# Patient Record
Sex: Male | Born: 1975 | Race: Asian | Hispanic: No | Marital: Married | Smoking: Never smoker
Health system: Southern US, Community
[De-identification: ages and names within clinical notes are randomized; demographics above are authoritative.]

## PROBLEM LIST (undated history)

## (undated) DIAGNOSIS — I10 Essential (primary) hypertension: Secondary | ICD-10-CM

## (undated) DIAGNOSIS — F1090 Alcohol use, unspecified, uncomplicated: Secondary | ICD-10-CM

## (undated) DIAGNOSIS — R519 Headache, unspecified: Secondary | ICD-10-CM

## (undated) DIAGNOSIS — R000 Tachycardia, unspecified: Secondary | ICD-10-CM

## (undated) DIAGNOSIS — E782 Mixed hyperlipidemia: Secondary | ICD-10-CM

## (undated) HISTORY — DX: Mixed hyperlipidemia: E78.2

## (undated) HISTORY — DX: Essential (primary) hypertension: I10

## (undated) HISTORY — DX: Alcohol use, unspecified, uncomplicated: F10.90

## (undated) HISTORY — DX: Tachycardia, unspecified: R00.0

## (undated) HISTORY — DX: Headache, unspecified: R51.9

---

## 2015-04-14 ENCOUNTER — Emergency Department (HOSPITAL_COMMUNITY)
Admission: EM | Admit: 2015-04-14 | Discharge: 2015-04-14 | Disposition: A | Discharge status: Home / Self Care | Payer: 59 | Attending: Emergency Medicine | Admitting: Emergency Medicine

## 2015-04-14 ENCOUNTER — Encounter (HOSPITAL_COMMUNITY): Payer: Self-pay | Admitting: Emergency Medicine

## 2015-04-14 DIAGNOSIS — M5440 Lumbago with sciatica, unspecified side: Secondary | ICD-10-CM | POA: Insufficient documentation

## 2015-04-14 DIAGNOSIS — M545 Low back pain: Secondary | ICD-10-CM | POA: Diagnosis present

## 2015-04-14 DIAGNOSIS — M5441 Lumbago with sciatica, right side: Secondary | ICD-10-CM

## 2015-04-14 DIAGNOSIS — M5442 Lumbago with sciatica, left side: Secondary | ICD-10-CM

## 2015-04-14 MED ORDER — IBUPROFEN 800 MG PO TABS: 800.0000 mg | ORAL_TABLET | Freq: Three times a day (TID) | ORAL | Status: AC

## 2015-04-14 MED ORDER — HYDROCODONE-ACETAMINOPHEN 5-325 MG PO TABS
1.0000 | ORAL_TABLET | Freq: Four times a day (QID) | ORAL | Status: AC | PRN
Start: 2015-04-14 — End: ?

## 2015-04-14 NOTE — ED Provider Notes (Signed)
CSN: [id number]     Arrival date & time 04/14/15  1715 History  This chart was scribed for non-physician practitioner, Roxy Horsemanobert [facility] Teegarden, PA-C working with Azalia BilisKevin Campos, MD by Doreatha MartinEva Mathews, ED scribe. This patient was seen in room WTR6/WTR6 and the patient's care was started at 5:32 PM    Chief Complaint  Patient presents with  . Back Pain   The history is provided by the patient. No language interpreter was used.    HPI Comments: Victor Sweeney is a 39 y.o. male who presents to the Emergency Department with a chief complaint of intermittent, moderate, pinching back pain onset 5 years ago and worsened this morning at 0800. Pt reports that he bent down this morning and his pain began after that. Pt notes that pain can radiate down his legs when he walks. He states that pain is relieved with sitting and is worsened by standing erect, movement and walking. Pt states that at work, he lifts 20 lbs or less. He denies heavy lifting, falls, or prior injury. He also denies dysuria, numbness, and incontinence x2.   History reviewed. No pertinent past medical history. History reviewed. No pertinent past surgical history. No family history on file. History  Substance Use Topics  . Smoking status: Never Smoker   . Smokeless tobacco: Not on file  . Alcohol Use: No    Review of Systems  Constitutional: Negative for fever and chills.  Gastrointestinal:       No bowel incontinence  Genitourinary: Negative for dysuria.       Negative for bowel and bladder incontinence.   Musculoskeletal: Positive for myalgias, back pain and arthralgias.  Neurological: Positive for weakness. Negative for numbness.       No saddle anesthesia   Allergies  Review of patient's allergies indicates no known allergies.  Home Medications   Prior to Admission medications   Not on File   BP 117/90 mmHg  Pulse 95  Temp(Src) 98.6 F (37 C) (Oral)  Resp 17  Ht 5' 6.5" (1.689 m)  Wt 150 lb (68.04 kg)  BMI 23.85 kg/m2  SpO2  100% Physical Exam  Constitutional: He is oriented to person, place, and time. He appears well-developed and well-nourished. No distress.  HENT:  Head: Normocephalic and atraumatic.  Eyes: Conjunctivae and EOM are normal. Pupils are equal, round, and reactive to light. Right eye exhibits no discharge. Left eye exhibits no discharge. No scleral icterus.  Neck: Normal range of motion. Neck supple. No tracheal deviation present.  Cardiovascular: Normal rate, regular rhythm and normal heart sounds.  Exam reveals no gallop and no friction rub.   No murmur heard. Pulmonary/Chest: Effort normal and breath sounds normal. No respiratory distress. He has no wheezes.  Abdominal: Soft. He exhibits no distension. There is no tenderness.  Musculoskeletal: Normal range of motion.  Lumbar paraspinal muscles tender to palpation, no bony tenderness, step-offs, or gross abnormality or deformity of spine, patient is able to ambulate, moves all extremities  Bilateral great toe extension intact Bilateral plantar/dorsiflexion intact  Neurological: He is alert and oriented to person, place, and time. He has normal reflexes.  Sensation and strength intact bilaterally Symmetrical reflexes  Skin: Skin is warm and dry. He is not diaphoretic.  Psychiatric: He has a normal mood and affect. His behavior is normal. Judgment and thought content normal.  Nursing note and vitals reviewed.  ED Course  Procedures (including critical care time) DIAGNOSTIC STUDIES: Oxygen Saturation is 100% on RA, normal by my interpretation.  COORDINATION OF CARE: 5:38 PM Discussed treatment plan with pt at bedside and pt agreed to plan.   Labs Review Labs Reviewed - No data to display  Imaging Review No results found.   EKG Interpretation None      MDM   Final diagnoses:  Bilateral low back pain with sciatica, sciatica laterality unspecified    Patient with back pain.  No neurological deficits and normal neuro exam.   Patient is ambulatory.  No loss of bowel or bladder control.  Doubt cauda equina.  Denies fever,  doubt epidural abscess or other lesion. Recommend back exercises, stretching, RICE, and will treat with a short course of norco.  Encouraged the patient that there could be a need for additional workup and/or imaging such as MRI, if the symptoms do not resolve. Patient advised that if the back pain does not resolve, or radiates, this could progress to more serious conditions and is encouraged to follow-up with PCP or orthopedics within 2 weeks.    I personally performed the services described in this documentation, which was scribed in my presence. The recorded information has been reviewed and is accurate.    Roxy Horseman, PA-C 04/14/15 1812  Azalia Bilis, MD 04/15/15 [phone]

## 2015-04-14 NOTE — ED Notes (Signed)
Pt states that his lower back started hurting around 8am this morning while working.  Pt states that he works at [employer]. Pt denies lifting anything heavy, maybe about 5lbs.

## 2015-04-14 NOTE — Discharge Instructions (Signed)
Back Pain, Adult Low back pain is very common. About 1 in 5 people have back pain.The cause of low back pain is rarely dangerous. The pain often gets better over time.About half of people with a sudden onset of back pain feel better in just 2 weeks. About 8 in 10 people feel better by 6 weeks.  CAUSES Some common causes of back pain include:  Strain of the muscles or ligaments supporting the spine.  Wear and tear (degeneration) of the spinal discs.  Arthritis.  Direct injury to the back. DIAGNOSIS Most of the time, the direct cause of low back pain is not known.However, back pain can be treated effectively even when the exact cause of the pain is unknown.Answering your caregiver's questions about your overall health and symptoms is one of the most accurate ways to make sure the cause of your pain is not dangerous. If your caregiver needs more information, he or she may order lab work or imaging tests (X-rays or MRIs).However, even if imaging tests show changes in your back, this usually does not require surgery. HOME CARE INSTRUCTIONS For many people, back pain returns.Since low back pain is rarely dangerous, it is often a condition that people can learn to manageon their own.   Remain active. It is stressful on the back to sit or stand in one place. Do not sit, drive, or stand in one place for more than 30 minutes at a time. Take short walks on level surfaces as soon as pain allows.Try to increase the length of time you walk each day.  Do not stay in bed.Resting more than 1 or 2 days can delay your recovery.  Do not avoid exercise or work.Your body is made to move.It is not dangerous to be active, even though your back may hurt.Your back will likely heal faster if you return to being active before your pain is gone.  Pay attention to your body when you bend and lift. Many people have less discomfortwhen lifting if they bend their knees, keep the load close to their bodies,and  avoid twisting. Often, the most comfortable positions are those that put less stress on your recovering back.  Find a comfortable position to sleep. Use a firm mattress and lie on your side with your knees slightly bent. If you lie on your back, put a pillow under your knees.  Only take over-the-counter or prescription medicines as directed by your caregiver. Over-the-counter medicines to reduce pain and inflammation are often the most helpful.Your caregiver may prescribe muscle relaxant drugs.These medicines help dull your pain so you can more quickly return to your normal activities and healthy exercise.  Put ice on the injured area.  Put ice in a plastic bag.  Place a towel between your skin and the bag.  Leave the ice on for 15-20 minutes, 03-04 times a day for the first 2 to 3 days. After that, ice and heat may be alternated to reduce pain and spasms.  Ask your caregiver about trying back exercises and gentle massage. This may be of some benefit.  Avoid feeling anxious or stressed.Stress increases muscle tension and can worsen back pain.It is important to recognize when you are anxious or stressed and learn ways to manage it.Exercise is a great option. SEEK MEDICAL CARE IF:  You have pain that is not relieved with rest or medicine.  You have pain that does not improve in 1 week.  You have new symptoms.  You are generally not feeling well. SEEK   IMMEDIATE MEDICAL CARE IF:   You have pain that radiates from your back into your legs.  You develop new bowel or bladder control problems.  You have unusual weakness or numbness in your arms or legs.  You develop nausea or vomiting.  You develop abdominal pain.  You feel faint. Document Released: 10/23/2005 Document Revised: 04/23/2012 Document Reviewed: 02/24/2014 Exit[employer] Patient Information 2015 [employer]. This information is not intended to replace advice given to you by your health care provider. Make sure you  discuss any questions you have with your health care provider.  

## 2016-04-13 DIAGNOSIS — Z0000 Encounter for general adult medical examination without abnormal findings: Secondary | ICD-10-CM | POA: Diagnosis not present

## 2016-04-13 DIAGNOSIS — R0683 Snoring: Secondary | ICD-10-CM | POA: Diagnosis not present

## 2016-04-13 DIAGNOSIS — E785 Hyperlipidemia, unspecified: Secondary | ICD-10-CM | POA: Diagnosis not present

## 2016-04-13 DIAGNOSIS — Z6824 Body mass index (BMI) 24.0-24.9, adult: Secondary | ICD-10-CM | POA: Diagnosis not present

## 2016-04-13 DIAGNOSIS — M545 Low back pain: Secondary | ICD-10-CM | POA: Diagnosis not present

## 2016-05-17 DIAGNOSIS — R0682 Tachypnea, not elsewhere classified: Secondary | ICD-10-CM | POA: Diagnosis not present

## 2016-05-29 DIAGNOSIS — G4730 Sleep apnea, unspecified: Secondary | ICD-10-CM | POA: Diagnosis not present

## 2017-07-04 DIAGNOSIS — H40023 Open angle with borderline findings, high risk, bilateral: Secondary | ICD-10-CM | POA: Diagnosis not present

## 2017-07-16 DIAGNOSIS — R51 Headache: Secondary | ICD-10-CM | POA: Diagnosis not present

## 2017-07-16 DIAGNOSIS — I10 Essential (primary) hypertension: Secondary | ICD-10-CM | POA: Diagnosis not present

## 2017-08-16 DIAGNOSIS — Z23 Encounter for immunization: Secondary | ICD-10-CM | POA: Diagnosis not present

## 2017-09-07 DIAGNOSIS — R51 Headache: Secondary | ICD-10-CM | POA: Diagnosis not present

## 2017-09-14 DIAGNOSIS — E782 Mixed hyperlipidemia: Secondary | ICD-10-CM | POA: Diagnosis not present

## 2017-09-14 DIAGNOSIS — R945 Abnormal results of liver function studies: Secondary | ICD-10-CM | POA: Diagnosis not present

## 2017-12-19 DIAGNOSIS — Z13220 Encounter for screening for lipoid disorders: Secondary | ICD-10-CM | POA: Diagnosis not present

## 2017-12-19 DIAGNOSIS — R030 Elevated blood-pressure reading, without diagnosis of hypertension: Secondary | ICD-10-CM | POA: Diagnosis not present

## 2017-12-19 DIAGNOSIS — Z0000 Encounter for general adult medical examination without abnormal findings: Secondary | ICD-10-CM | POA: Diagnosis not present

## 2018-05-24 DIAGNOSIS — R030 Elevated blood-pressure reading, without diagnosis of hypertension: Secondary | ICD-10-CM | POA: Diagnosis not present

## 2018-05-24 DIAGNOSIS — G43009 Migraine without aura, not intractable, without status migrainosus: Secondary | ICD-10-CM | POA: Diagnosis not present

## 2018-06-02 DIAGNOSIS — T63441A Toxic effect of venom of bees, accidental (unintentional), initial encounter: Secondary | ICD-10-CM | POA: Diagnosis not present

## 2018-06-02 DIAGNOSIS — S80261A Insect bite (nonvenomous), right knee, initial encounter: Secondary | ICD-10-CM | POA: Diagnosis not present

## 2020-04-21 DIAGNOSIS — H02051 Trichiasis without entropian right upper eyelid: Secondary | ICD-10-CM | POA: Diagnosis not present

## 2020-04-21 DIAGNOSIS — H52223 Regular astigmatism, bilateral: Secondary | ICD-10-CM | POA: Diagnosis not present

## 2020-04-21 DIAGNOSIS — H35363 Drusen (degenerative) of macula, bilateral: Secondary | ICD-10-CM | POA: Diagnosis not present

## 2020-04-21 DIAGNOSIS — H02054 Trichiasis without entropian left upper eyelid: Secondary | ICD-10-CM | POA: Diagnosis not present

## 2020-04-21 DIAGNOSIS — H40023 Open angle with borderline findings, high risk, bilateral: Secondary | ICD-10-CM | POA: Diagnosis not present

## 2020-06-23 DIAGNOSIS — Z0000 Encounter for general adult medical examination without abnormal findings: Secondary | ICD-10-CM | POA: Diagnosis not present

## 2020-06-23 DIAGNOSIS — E782 Mixed hyperlipidemia: Secondary | ICD-10-CM | POA: Diagnosis not present

## 2020-11-24 DIAGNOSIS — I10 Essential (primary) hypertension: Secondary | ICD-10-CM | POA: Diagnosis not present

## 2020-11-24 DIAGNOSIS — G4730 Sleep apnea, unspecified: Secondary | ICD-10-CM | POA: Diagnosis not present

## 2020-12-21 DIAGNOSIS — E782 Mixed hyperlipidemia: Secondary | ICD-10-CM | POA: Diagnosis not present

## 2020-12-21 DIAGNOSIS — I10 Essential (primary) hypertension: Secondary | ICD-10-CM | POA: Diagnosis not present

## 2021-03-23 DIAGNOSIS — I10 Essential (primary) hypertension: Secondary | ICD-10-CM | POA: Diagnosis not present

## 2021-03-23 DIAGNOSIS — K625 Hemorrhage of anus and rectum: Secondary | ICD-10-CM | POA: Diagnosis not present

## 2021-05-08 ENCOUNTER — Emergency Department (HOSPITAL_COMMUNITY): Payer: BC Managed Care – PPO

## 2021-05-08 ENCOUNTER — Encounter (HOSPITAL_COMMUNITY): Payer: Self-pay

## 2021-05-08 ENCOUNTER — Emergency Department (HOSPITAL_COMMUNITY)
Admission: EM | Admit: 2021-05-08 | Discharge: 2021-05-08 | Disposition: A | Discharge status: Home / Self Care | Payer: BC Managed Care – PPO | Attending: Emergency Medicine | Admitting: Emergency Medicine

## 2021-05-08 ENCOUNTER — Other Ambulatory Visit: Payer: Self-pay

## 2021-05-08 DIAGNOSIS — M542 Cervicalgia: Secondary | ICD-10-CM | POA: Insufficient documentation

## 2021-05-08 DIAGNOSIS — I10 Essential (primary) hypertension: Secondary | ICD-10-CM | POA: Diagnosis not present

## 2021-05-08 DIAGNOSIS — Z79899 Other long term (current) drug therapy: Secondary | ICD-10-CM | POA: Diagnosis not present

## 2021-05-08 DIAGNOSIS — R519 Headache, unspecified: Secondary | ICD-10-CM | POA: Diagnosis not present

## 2021-05-08 DIAGNOSIS — R1110 Vomiting, unspecified: Secondary | ICD-10-CM | POA: Diagnosis not present

## 2021-05-08 HISTORY — DX: Essential (primary) hypertension: I10

## 2021-05-08 LAB — URINALYSIS, ROUTINE W REFLEX MICROSCOPIC
Bilirubin Urine: NEGATIVE
Glucose, UA: NEGATIVE mg/dL
Hgb urine dipstick: NEGATIVE
Ketones, ur: 5 mg/dL — AB
Leukocytes,Ua: NEGATIVE
Nitrite: NEGATIVE
Protein, ur: NEGATIVE mg/dL
Specific Gravity, Urine: 1.018 (ref 1.005–1.030)
pH: 9 — ABNORMAL HIGH (ref 5.0–8.0)

## 2021-05-08 LAB — COMPREHENSIVE METABOLIC PANEL
ALT: 55 U/L — ABNORMAL HIGH (ref 0–44)
AST: 34 U/L (ref 15–41)
Albumin: 4.7 g/dL (ref 3.5–5.0)
Alkaline Phosphatase: 43 U/L (ref 38–126)
Anion gap: 8 (ref 5–15)
BUN: 23 mg/dL — ABNORMAL HIGH (ref 6–20)
CO2: 25 mmol/L (ref 22–32)
Calcium: 8.9 mg/dL (ref 8.9–10.3)
Chloride: 104 mmol/L (ref 98–111)
Creatinine, Ser: 0.82 mg/dL (ref 0.61–1.24)
GFR, Estimated: >60 mL/min (ref >60)
Glucose, Bld: 117 mg/dL — ABNORMAL HIGH (ref 70–99)
Potassium: 4 mmol/L (ref 3.5–5.1)
Sodium: 137 mmol/L (ref 135–145)
Total Bilirubin: 0.7 mg/dL (ref 0.3–1.2)
Total Protein: 8.3 g/dL — ABNORMAL HIGH (ref 6.5–8.1)

## 2021-05-08 LAB — CBC
HCT: 42.6 % (ref 39.0–52.0)
Hemoglobin: 14.6 g/dL (ref 13.0–17.0)
MCH: 29.9 pg (ref 26.0–34.0)
MCHC: 34.3 g/dL (ref 30.0–36.0)
MCV: 87.1 fL (ref 80.0–100.0)
Platelets: 258 10*3/uL (ref 150–400)
RBC: 4.89 MIL/uL (ref 4.22–5.81)
RDW: 12.4 % (ref 11.5–15.5)
WBC: 10.7 10*3/uL — ABNORMAL HIGH (ref 4.0–10.5)
nRBC: 0 % (ref 0.0–0.2)

## 2021-05-08 MED ORDER — DIPHENHYDRAMINE HCL 50 MG/ML IJ SOLN
25.0000 mg | Freq: Once | INTRAMUSCULAR | Status: AC
Start: 2021-05-08 — End: 2021-05-08
  Administered 2021-05-08: 25 mg via INTRAVENOUS
  Filled 2021-05-08: qty 1

## 2021-05-08 MED ORDER — METOCLOPRAMIDE HCL 5 MG/ML IJ SOLN
10.0000 mg | Freq: Once | INTRAMUSCULAR | Status: AC
  Administered 2021-05-08: 10 mg via INTRAVENOUS
  Filled 2021-05-08: qty 2

## 2021-05-08 MED ORDER — SODIUM CHLORIDE 0.9 % IV BOLUS
1000.0000 mL | Freq: Once | INTRAVENOUS | Status: AC
  Administered 2021-05-08: 1000 mL via INTRAVENOUS

## 2021-05-08 NOTE — ED Triage Notes (Addendum)
Patient states that he had a headache at 1100 today. Patient took his BP medication at that time along with Tylenol. Patient states he threw up his meds approx 1 hour later.   Patient states his BP at home at 1330 was-200/135.  BP in triage-170/118. Patient continues to c/o headache and slight posterior neck pain. Patient states that his headache worsens with smells.

## 2021-05-08 NOTE — ED Provider Notes (Signed)
[facility]-EMERGENCY DEPT Provider Note   CSN: [id number] Arrival date & time: 05/08/21  1502     History Chief Complaint  Patient presents with   Hypertension    Victor Sweeney is a 45 y.o. male.  HPI 45 year old male presents with headache.  The headache is bitemporal.  He has a remote history of migraines but has not had one in a few years.  This morning he realized he had forgotten to take his triamterene-HCTZ and so he took it a little late.  At around 11 AM he developed a headache.  At first it was mild to moderate and waxing and waning.  After about an hour it became more severe.  This made him check his blood pressure and it was 200/130.  He tried to take a Tylenol but then vomited up 30 minutes later.  He had some mild neck discomfort posteriorly but that has resolved.  His headache has progressively improved and now is about a 3 or 4 out of 10.  The patient states that the headache intensity and quality is similar to when he had the migraines in the past.  No focal weakness or numbness.  No fevers or vision changes.  Past Medical History:  Diagnosis Date   Hypertension     There are no problems to display for this patient.   History reviewed. No pertinent surgical history.     Family History  Problem Relation Age of Onset   Hypertension Mother    Stroke Father     Social History   Tobacco Use   Smoking status: Never   Smokeless tobacco: Never  Vaping Use   Vaping Use: Never used  Substance Use Topics   Alcohol use: Yes   Drug use: Never    Home Medications Prior to Admission medications   Medication Sig Start Date End Date Taking? Authorizing Provider  HYDROcodone-acetaminophen (NORCO/VICODIN) 5-325 MG per tablet Take 1 tablet by mouth every 6 (six) hours as needed. 04/14/15   Roxy Horseman, PA-C  ibuprofen (ADVIL,MOTRIN) 800 MG tablet Take 1 tablet (800 mg total) by mouth 3 (three) times daily. 04/14/15   Roxy Horseman, PA-C   triamterene-hydrochlorothiazide (MAXZIDE-25) 37.5-25 MG tablet Take 1 tablet by mouth daily. 04/11/21   [provider]    Allergies    Patient has no known allergies.  Review of Systems   Review of Systems  Constitutional:  Negative for fever.  Eyes:  Negative for visual disturbance.  Gastrointestinal:  Positive for vomiting.  Musculoskeletal:  Positive for neck pain. Negative for neck stiffness.  Neurological:  Positive for headaches. Negative for weakness and numbness.  All other systems reviewed and are negative.  Physical Exam Updated Vital Signs BP (!) 167/117 (BP Location: Right Arm)   Pulse 68   Temp 98.2 F (36.8 C) (Oral)   Resp 16   Ht 5\' 7"  (1.702 m)   Wt 72.6 kg   SpO2 100%   BMI 25.06 kg/m   Physical Exam Vitals and nursing note reviewed.  Constitutional:      Appearance: He is well-developed.  HENT:     Head: Normocephalic and atraumatic.     Right Ear: External ear normal.     Left Ear: External ear normal.     Nose: Nose normal.  Eyes:     General:        Right eye: No discharge.        Left eye: No discharge.     Extraocular  Movements: Extraocular movements intact.     Pupils: Pupils are equal, round, and reactive to light.  Cardiovascular:     Rate and Rhythm: Normal rate and regular rhythm.     Heart sounds: Normal heart sounds.  Pulmonary:     Effort: Pulmonary effort is normal.     Breath sounds: Normal breath sounds.  Abdominal:     Palpations: Abdomen is soft.     Tenderness: There is no abdominal tenderness.  Musculoskeletal:     Cervical back: Normal range of motion and neck supple. No rigidity.  Skin:    General: Skin is warm and dry.  Neurological:     Mental Status: He is alert.     Comments: CN 3-12 grossly intact. 5/5 strength in all 4 extremities. Grossly normal sensation. Normal finger to nose.   Psychiatric:        Mood and Affect: Mood is not anxious.    ED Results / Procedures / Treatments   Labs (all labs  ordered are listed, but only abnormal results are displayed) Labs Reviewed  COMPREHENSIVE METABOLIC PANEL - Abnormal; Notable for the following components:      Result Value   Glucose, Bld 117 (*)    BUN 23 (*)    Total Protein 8.3 (*)    ALT 55 (*)    All other components within normal limits  CBC - Abnormal; Notable for the following components:   WBC 10.7 (*)    All other components within normal limits  URINALYSIS, ROUTINE W REFLEX MICROSCOPIC - Abnormal; Notable for the following components:   APPearance CLOUDY (*)    pH 9.0 (*)    Ketones, ur 5 (*)    All other components within normal limits    EKG None  Radiology CT Head Wo Contrast  Result Date: 05/08/2021 CLINICAL DATA:  Severe headache.  Vomiting.  Hypertension. EXAM: CT HEAD WITHOUT CONTRAST TECHNIQUE: Contiguous axial images were obtained from the base of the skull through the vertex without intravenous contrast. COMPARISON:  None. FINDINGS: Brain: No evidence of acute infarction, hemorrhage, hydrocephalus, extra-axial collection, or mass lesion/mass effect. Vascular:  No hyperdense vessel or other acute findings. Skull: No evidence of fracture or other significant bone abnormality. Sinuses/Orbits:  No acute findings. Other: None. IMPRESSION: Negative noncontrast head CT. Electronically Signed   By: Danae Orleans M.D.   On: 05/08/2021 20:05    Procedures Procedures   Medications Ordered in ED Medications  sodium chloride 0.9 % bolus 1,000 mL (0 mLs Intravenous Stopped 05/08/21 2109)  metoCLOPramide (REGLAN) injection 10 mg (10 mg Intravenous Given 05/08/21 2015)  diphenhydrAMINE (BENADRYL) injection 25 mg (25 mg Intravenous Given 05/08/21 2014)    ED Course  I have reviewed the triage vital signs and the nursing notes.  Pertinent labs & imaging results that were available during my care of the patient were reviewed by me and considered in my medical decision making (see chart for details).    MDM  Rules/Calculators/A&P                          Patient's temporal headache has improved.  When further asking patient, he states this is very similar to prior migraines, it just has not happened in a while.  I discussed that given it did seem to get to maximum intensity in an hour or so, we should at least consider subarachnoid hemorrhage.  CT head was obtained and is unremarkable.  However this  is outside of the 6-hour window.  We discussed possible next steps including possible lumbar puncture.  However patient declines stating this feels more like his typical migraines.  We discussed that we could be possibly missing a subtle hemorrhage and he understands.  My suspicion of infection is pretty low.  At this point he is well-appearing and stable for discharge home but I did advise of strict return precautions. Final Clinical Impression(s) / ED Diagnoses Final diagnoses:  Temporal headache    Rx / DC Orders ED Discharge Orders     None        Pricilla Loveless, MD 05/08/21 2213

## 2021-05-08 NOTE — Discharge Instructions (Addendum)
If you develop continued, recurrent, or worsening headache, fever, neck stiffness, vomiting, blurry or double vision, weakness or numbness in your arms or legs, trouble speaking, or any other new/concerning symptoms then return to the ER for evaluation.  

## 2021-05-12 DIAGNOSIS — G43009 Migraine without aura, not intractable, without status migrainosus: Secondary | ICD-10-CM | POA: Diagnosis not present

## 2021-05-12 DIAGNOSIS — I10 Essential (primary) hypertension: Secondary | ICD-10-CM | POA: Diagnosis not present

## 2021-05-23 DIAGNOSIS — R519 Headache, unspecified: Secondary | ICD-10-CM | POA: Diagnosis not present

## 2021-05-23 DIAGNOSIS — I10 Essential (primary) hypertension: Secondary | ICD-10-CM | POA: Diagnosis not present

## 2021-05-23 DIAGNOSIS — Z7289 Other problems related to lifestyle: Secondary | ICD-10-CM | POA: Diagnosis not present

## 2021-08-24 DIAGNOSIS — E782 Mixed hyperlipidemia: Secondary | ICD-10-CM | POA: Diagnosis not present

## 2021-08-24 DIAGNOSIS — F1020 Alcohol dependence, uncomplicated: Secondary | ICD-10-CM | POA: Diagnosis not present

## 2021-08-24 DIAGNOSIS — Z7289 Other problems related to lifestyle: Secondary | ICD-10-CM | POA: Diagnosis not present

## 2021-08-24 DIAGNOSIS — I10 Essential (primary) hypertension: Secondary | ICD-10-CM | POA: Diagnosis not present

## 2021-08-24 DIAGNOSIS — R000 Tachycardia, unspecified: Secondary | ICD-10-CM | POA: Diagnosis not present

## 2021-10-12 ENCOUNTER — Ambulatory Visit (INDEPENDENT_AMBULATORY_CARE_PROVIDER_SITE_OTHER): Payer: BC Managed Care – PPO

## 2021-10-12 ENCOUNTER — Other Ambulatory Visit: Payer: Self-pay

## 2021-10-12 ENCOUNTER — Encounter: Payer: Self-pay | Admitting: Internal Medicine

## 2021-10-12 ENCOUNTER — Ambulatory Visit: Payer: BC Managed Care – PPO | Admitting: Internal Medicine

## 2021-10-12 VITALS — BP 140/98 | HR 83 | Ht 67.0 in | Wt 169.0 lb

## 2021-10-12 DIAGNOSIS — R000 Tachycardia, unspecified: Secondary | ICD-10-CM

## 2021-10-12 DIAGNOSIS — I10 Essential (primary) hypertension: Secondary | ICD-10-CM | POA: Diagnosis not present

## 2021-10-12 DIAGNOSIS — E785 Hyperlipidemia, unspecified: Secondary | ICD-10-CM | POA: Diagnosis not present

## 2021-10-12 NOTE — Progress Notes (Signed)
Cardiology Office Note:    Date:  10/12/2021   ID:  Victor Sweeney, DOB 09-30-1976, MRN [id number]  PCP:  [facility] Providers Cardiologist:  None     Referring MD: Janace Aris, NP   CC: Tachycardia Consulted for the evaluation of tachycardia at the behest of Victor Sweeney, [facility] @ [place]   History of Present Illness:    Victor Sweeney is a 45 y.o. male with a hx of HTN, heavy alcohol use, and HLD who presents 10/12/21.  Patient notes that he is feeling OK, but a bit tired. Has had no chest pain, chest pressure, chest tightness, chest stinging.  When he breathes, he has occasional chest discomfort.  Patient exertion notable for biking (once or twice a week) and feels no symptoms.  Was able to work out longer in the past with improved  tolerance.  Work and kids have made him more busy and this has kept him from working out as much.  Only have time to do things when he is not working or with his family.  No shortness of breath, DOE.  No PND or orthopnea.  No weight gain, leg swelling , or abdominal swelling.  No syncope or near syncope . Notes palpitations with stress (a colleague playing loud radio music at work for one example).     Past Medical History:  Diagnosis Date   Headache    Heavy alcohol use    HTN (hypertension)    Hypertension    Mixed hyperlipidemia    Tachycardia     No past surgical history on file.  Current Medications: Current Meds  Medication Sig   HYDROcodone-acetaminophen (NORCO/VICODIN) 5-325 MG per tablet Take 1 tablet by mouth every 6 (six) hours as needed.   ibuprofen (ADVIL,MOTRIN) 800 MG tablet Take 1 tablet (800 mg total) by mouth 3 (three) times daily.   metoprolol succinate (TOPROL-XL) 25 MG 24 hr tablet Take 25 mg by mouth daily.   Multiple Vitamin (MULTIVITAMIN) tablet Take 1 tablet by mouth daily.   triamterene-hydrochlorothiazide (MAXZIDE-25) 37.5-25 MG tablet Take 1 tablet by mouth  daily.     Allergies:   Patient has no known allergies.   Social History   Socioeconomic History   Marital status: Married    Spouse name: Not on file   Number of children: Not on file   Years of education: Not on file   Highest education level: Not on file  Occupational History   Not on file  Tobacco Use   Smoking status: Never   Smokeless tobacco: Never  Vaping Use   Vaping Use: Never used  Substance and Sexual Activity   Alcohol use: Yes   Drug use: Never   Sexual activity: Not on file  Other Topics Concern   Not on file  Social History Narrative   Not on file   Social Determinants of Health   Financial Resource Strain: Not on file  Food Insecurity: Not on file  Transportation Needs: Not on file  Physical Activity: Not on file  Stress: Not on file  Social Connections: Not on file     Family History: The patient's family history includes Hypertension in his mother; Stroke in his father. Father had stroke when he was 9  ROS:   Please see the history of present illness.     All other systems reviewed and are negative.  EKGs/Labs/Other Studies Reviewed:    The following studies were reviewed today:  EKG:  EKG is  ordered today.  The ekg ordered today demonstrates  10/12/21: SR rate 83  Recent Labs: 05/08/2021: ALT 55; BUN 23; Creatinine, Ser 0.82; Hemoglobin 14.6; Platelets 258; Potassium 4.0; Sodium 137  Recent Lipid Panel No results found for: CHOL, TRIG, HDL, CHOLHDL, VLDL, LDLCALC, LDLDIRECT       Physical Exam:    VS:  BP (!) 140/98   Pulse 83   Ht 5\' 7"  (1.702 m)   Wt 169 lb (76.7 kg)   SpO2 97%   BMI 26.47 kg/m     Wt Readings from Last 3 Encounters:  10/12/21 169 lb (76.7 kg)  05/08/21 160 lb (72.6 kg)  04/14/15 150 lb (68 kg)     Gen: no distress Neck: No JVD,  carotid bruit Cardiac: No Rubs or Gallops, no murmur, regular rhythm +2radial pulses Respiratory: Clear to auscultation bilaterally, normal effort, normal  respiratory  rate GI: Soft, nontender, non-distended  MS: No  edema;  moves all extremities Integument: Skin feels warm Neuro:  At time of evaluation, alert and oriented to person/place/time/situation  Psych: Normal affect, patient feels OK   ASSESSMENT:    1. Tachycardia   2. Essential hypertension   3. Hyperlipidemia, unspecified hyperlipidemia type    PLAN:    Tachycardia- normal Hgb and TSH on 08/24/21 labs - suspect related to deconditioning and stress - will get 14 day non -live ziopatch to eval for arrhythmia With HTN and HLD - on Maxzide 25 and Toprol Xl 25 - if sx we could consider increase in succinate to 50 mg PO daily  PRN f/u unless finding on heart monitor.           Medication Adjustments/Labs and Tests Ordered: Current medicines are reviewed at length with the patient today.  Concerns regarding medicines are outlined above.  Orders Placed This Encounter  Procedures   LONG TERM MONITOR (3-14 DAYS)   EKG 12-Lead   No orders of the defined types were placed in this encounter.   Patient Instructions  Medication Instructions:  Your physician recommends that you continue on your current medications as directed. Please refer to the Current Medication list given to you today.  *If you need a refill on your cardiac medications before your next appointment, please call your pharmacy*   Lab Work: NONE If you have labs (blood work) drawn today and your tests are completely normal, you will receive your results only by: MyChart Message (if you have MyChart) OR A paper copy in the mail If you have any lab test that is abnormal or we need to change your treatment, we will call you to review the results.   Testing/Procedures: Your physician has requested that you wear a heart monitor.    Follow-Up: As needed  At [facility], you and your health needs are our priority.  As part of our continuing mission to provide you with exceptional heart care, we have created  designated Provider Care Teams.  These Care Teams include your primary Cardiologist (physician) and Advanced Practice Providers (APPs -  Physician Assistants and Nurse Practitioners) who all work together to provide you with the care you need, when you need it.  We recommend signing up for the patient portal called "MyChart".  Sign up information is provided on this After Visit Summary.  MyChart is used to connect with patients for Virtual Visits (Telemedicine).  Patients are able to view lab/test results, encounter notes, upcoming appointments, etc.  Non-urgent messages can be sent  to your provider as well.   To learn more about what you can do with MyChart, go to [email or web address].      Provider:  Riley Lam, MD:1}    Other Instructions ZIO XT- Long Term Monitor Instructions  Your physician has requested you wear a ZIO patch monitor for 14 days.  This is a single patch monitor. [employer] supplies one patch monitor per enrollment. Additional stickers are not available. Please do not apply patch if you will be having a Nuclear Stress Test,  Echocardiogram, Cardiac CT, MRI, or Chest Xray during the period you would be wearing the  monitor. The patch cannot be worn during these tests. You cannot remove and re-apply the  ZIO XT patch monitor.  Your ZIO patch monitor will be mailed 3 day USPS to your address on file. It may take 3-5 days  to receive your monitor after you have been enrolled.  Once you have received your monitor, please review the enclosed instructions. Your monitor  has already been registered assigning a specific monitor serial # to you.  Billing and Patient Assistance Program Information  We have supplied [employer] with any of your insurance information on file for billing purposes. [employer] offers a sliding scale Patient Assistance Program for patients that do not have  insurance, or whose insurance does not completely cover the cost of the ZIO monitor.  You  must apply for the Patient Assistance Program to qualify for this discounted rate.  To apply, please call [employer] at [phone], select option 4, select option 2, ask to apply for  Patient Assistance Program. Meredeth Ide will ask your household income, and how many people  are in your household. They will quote your out-of-pocket cost based on that information.  [employer] will also be able to set up a 52-month, interest-free payment plan if needed.  Applying the monitor   Shave hair from upper left chest.  Hold abrader disc by orange tab. Rub abrader in 40 strokes over the upper left chest as  indicated in your monitor instructions.  Clean area with 4 enclosed alcohol pads. Let dry.  Apply patch as indicated in monitor instructions. Patch will be placed under collarbone on left  side of chest with arrow pointing upward.  Rub patch adhesive wings for 2 minutes. Remove white label marked "1". Remove the white  label marked "2". Rub patch adhesive wings for 2 additional minutes.  While looking in a mirror, press and release button in center of patch. A small green light will  flash 3-4 times. This will be your only indicator that the monitor has been turned on.  Do not shower for the first 24 hours. You may shower after the first 24 hours.  Press the button if you feel a symptom. You will hear a small click. Record Date, Time and  Symptom in the Patient Logbook.  When you are ready to remove the patch, follow instructions on the last 2 pages of Patient  Logbook. Stick patch monitor onto the last page of Patient Logbook.  Place Patient Logbook in the blue and white box. Use locking tab on box and tape box closed  securely. The blue and white box has prepaid postage on it. Please place it in the mailbox as  soon as possible. Your physician should have your test results approximately 7 days after the  monitor has been mailed back to [facility].  Call [facility] Customer Care at [phone]  if you have questions regarding  your ZIO XT  patch monitor. Call them immediately if you see an orange light blinking on your  monitor.  If your monitor falls off in less than 4 days, contact our Monitor department at [phone].  If your monitor becomes loose or falls off after 4 days call [employer] at [phone] for  suggestions on securing your monitor     Signed, Christell Constant, MD  10/12/2021 4:25 PM    [facility]

## 2021-10-12 NOTE — Patient Instructions (Signed)
Medication Instructions:  Your physician recommends that you continue on your current medications as directed. Please refer to the Current Medication list given to you today.  *If you need a refill on your cardiac medications before your next appointment, please call your pharmacy*   Lab Work: NONE If you have labs (blood work) drawn today and your tests are completely normal, you will receive your results only by: MyChart Message (if you have MyChart) OR A paper copy in the mail If you have any lab test that is abnormal or we need to change your treatment, we will call you to review the results.   Testing/Procedures: Your physician has requested that you wear a heart monitor.    Follow-Up: As needed  At [facility], you and your health needs are our priority.  As part of our continuing mission to provide you with exceptional heart care, we have created designated Provider Care Teams.  These Care Teams include your primary Cardiologist (physician) and Advanced Practice Providers (APPs -  Physician Assistants and Nurse Practitioners) who all work together to provide you with the care you need, when you need it.  We recommend signing up for the patient portal called "MyChart".  Sign up information is provided on this After Visit Summary.  MyChart is used to connect with patients for Virtual Visits (Telemedicine).  Patients are able to view lab/test results, encounter notes, upcoming appointments, etc.  Non-urgent messages can be sent to your provider as well.   To learn more about what you can do with MyChart, go to [email or web address].      Provider:  Riley Lam, MD:1}    Other Instructions ZIO XT- Long Term Monitor Instructions  Your physician has requested you wear a ZIO patch monitor for 14 days.  This is a single patch monitor. [employer] supplies one patch monitor per enrollment. Additional stickers are not available. Please do not apply patch if you will be having  a Nuclear Stress Test,  Echocardiogram, Cardiac CT, MRI, or Chest Xray during the period you would be wearing the  monitor. The patch cannot be worn during these tests. You cannot remove and re-apply the  ZIO XT patch monitor.  Your ZIO patch monitor will be mailed 3 day USPS to your address on file. It may take 3-5 days  to receive your monitor after you have been enrolled.  Once you have received your monitor, please review the enclosed instructions. Your monitor  has already been registered assigning a specific monitor serial # to you.  Billing and Patient Assistance Program Information  We have supplied [employer] with any of your insurance information on file for billing purposes. [employer] offers a sliding scale Patient Assistance Program for patients that do not have  insurance, or whose insurance does not completely cover the cost of the ZIO monitor.  You must apply for the Patient Assistance Program to qualify for this discounted rate.  To apply, please call [employer] at [phone], select option 4, select option 2, ask to apply for  Patient Assistance Program. Meredeth Ide will ask your household income, and how many people  are in your household. They will quote your out-of-pocket cost based on that information.  [employer] will also be able to set up a 30-month, interest-free payment plan if needed.  Applying the monitor   Shave hair from upper left chest.  Hold abrader disc by orange tab. Rub abrader in 40 strokes over the upper left chest as  indicated in your monitor instructions.  Clean area with 4 enclosed alcohol pads. Let dry.  Apply patch as indicated in monitor instructions. Patch will be placed under collarbone on left  side of chest with arrow pointing upward.  Rub patch adhesive wings for 2 minutes. Remove white label marked "1". Remove the white  label marked "2". Rub patch adhesive wings for 2 additional minutes.  While looking in a mirror, press and release button in  center of patch. A small green light will  flash 3-4 times. This will be your only indicator that the monitor has been turned on.  Do not shower for the first 24 hours. You may shower after the first 24 hours.  Press the button if you feel a symptom. You will hear a small click. Record Date, Time and  Symptom in the Patient Logbook.  When you are ready to remove the patch, follow instructions on the last 2 pages of Patient  Logbook. Stick patch monitor onto the last page of Patient Logbook.  Place Patient Logbook in the blue and white box. Use locking tab on box and tape box closed  securely. The blue and white box has prepaid postage on it. Please place it in the mailbox as  soon as possible. Your physician should have your test results approximately 7 days after the  monitor has been mailed back to [facility].  Call [facility] Customer Care at [phone] if you have questions regarding  your ZIO XT patch monitor. Call them immediately if you see an orange light blinking on your  monitor.  If your monitor falls off in less than 4 days, contact our Monitor department at [phone].  If your monitor becomes loose or falls off after 4 days call [employer] at [phone] for  suggestions on securing your monitor

## 2021-10-12 NOTE — Progress Notes (Unsigned)
Enrolled patient for a 14 day Zio XT  monitor to be mailed to patients home  °

## 2021-10-16 DIAGNOSIS — R000 Tachycardia, unspecified: Secondary | ICD-10-CM | POA: Diagnosis not present

## 2021-11-04 DIAGNOSIS — R000 Tachycardia, unspecified: Secondary | ICD-10-CM | POA: Diagnosis not present

## 2021-12-05 IMAGING — CT CT HEAD W/O CM
3 series · 16 of 47 positions shown, 19 images · non-contrast
Comparison: None.

CLINICAL DATA: Severe headache.  Vomiting.  Hypertension.

EXAM:
CT HEAD WITHOUT CONTRAST
TECHNIQUE: Contiguous axial images were obtained from the base of the skull
through the vertex without intravenous contrast.

[Series 2: head wo · axial · 0.47mm/px · z∈[+1512,+1647]mm · 10 of 33 slices shown, 13 images]
[im 3/33  brain]
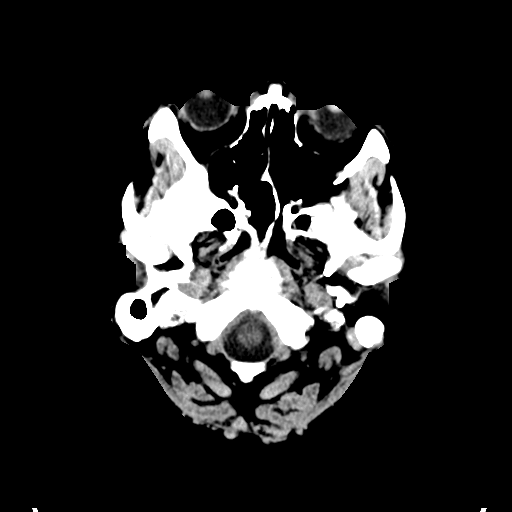
[im 3/33  bone]
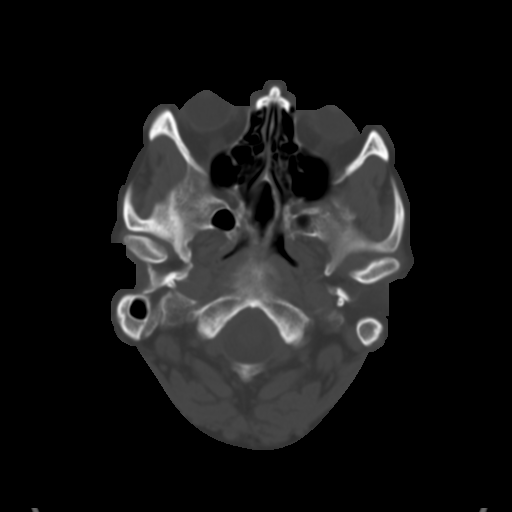
[im 6/33  brain]
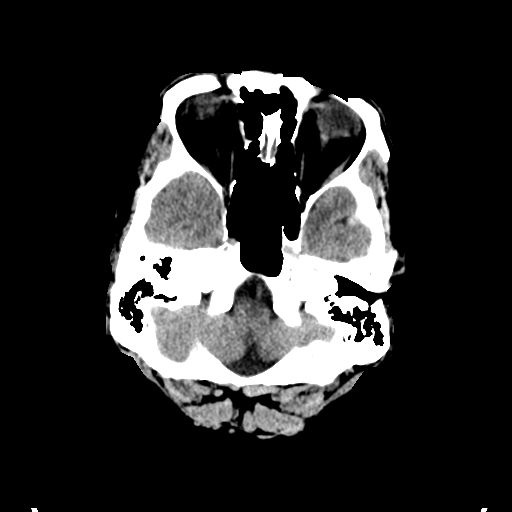
[im 9/33  brain]
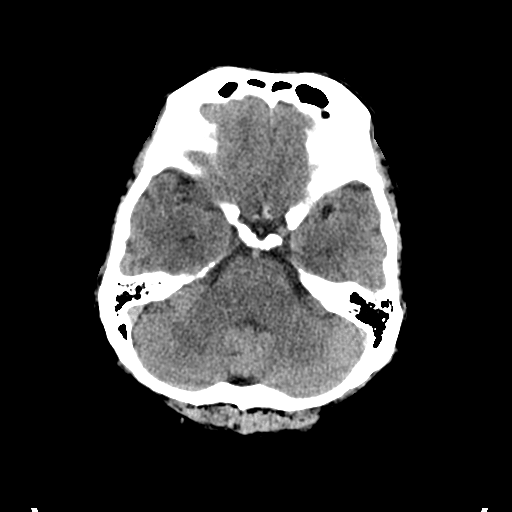
[im 12/33  brain]
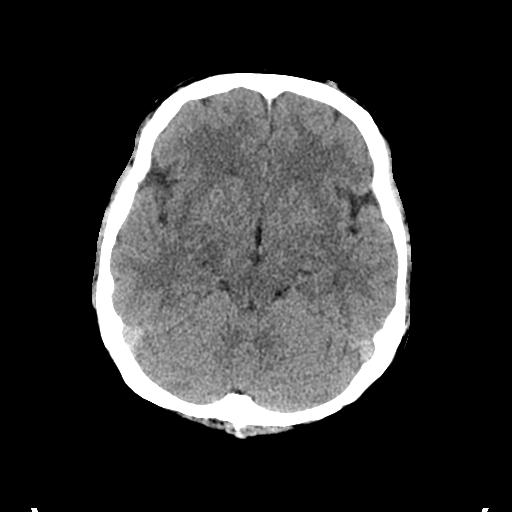
[im 15/33  brain]
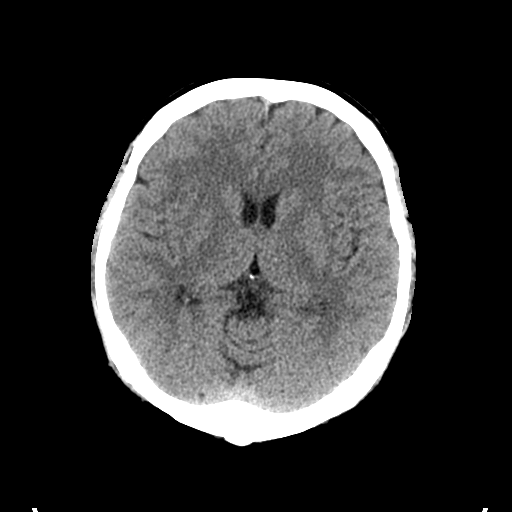
[im 15/33  bone]
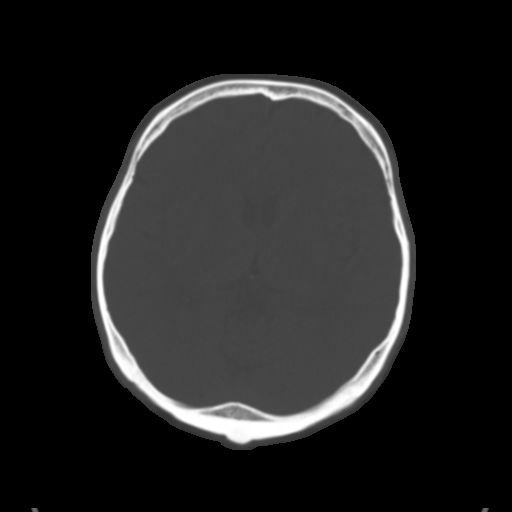
[im 18/33  brain]
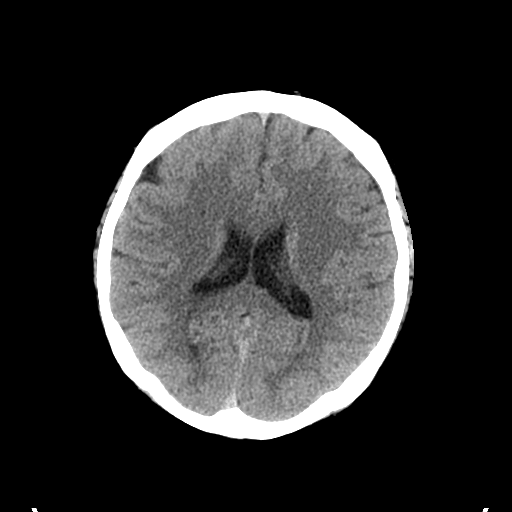
[im 21/33  brain]
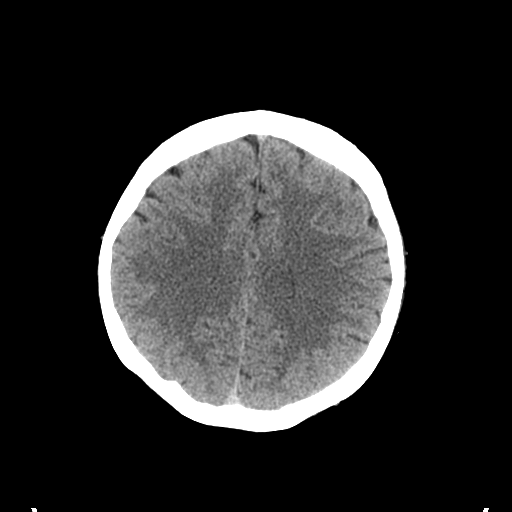
[im 25/33  brain]
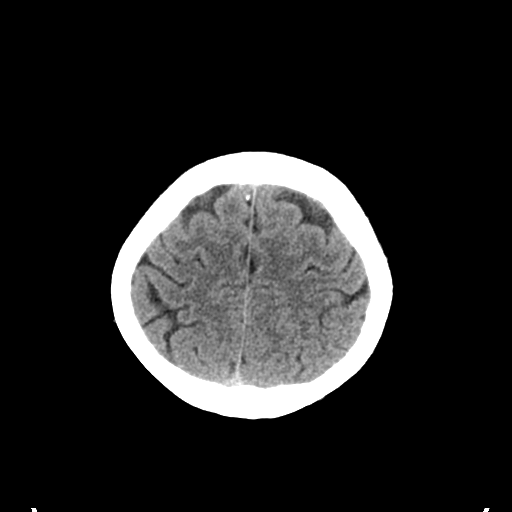
[im 27/33  brain]
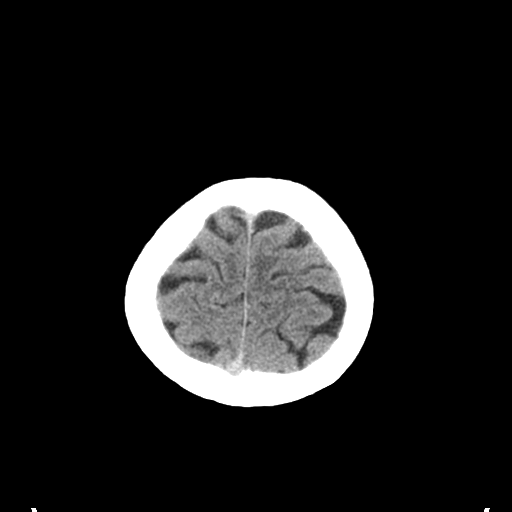
[im 27/33  bone]
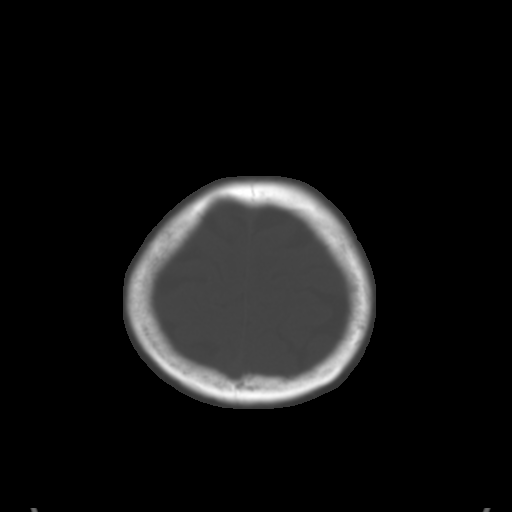
[im 30/33  brain]
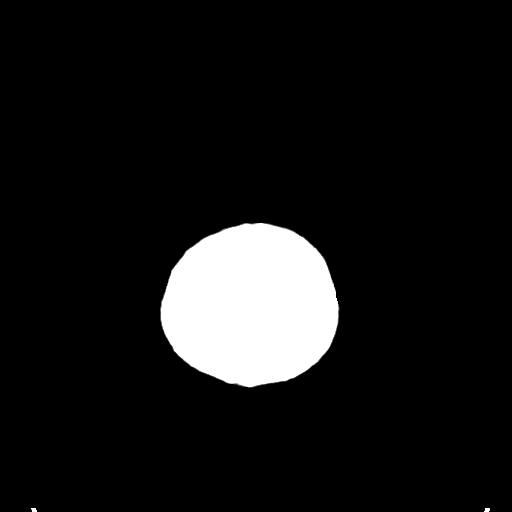

[Series 5: coronal soft tissue · coronal · 0.36mm/px · 3 of 65 slices shown]
[im 22/65  brain]
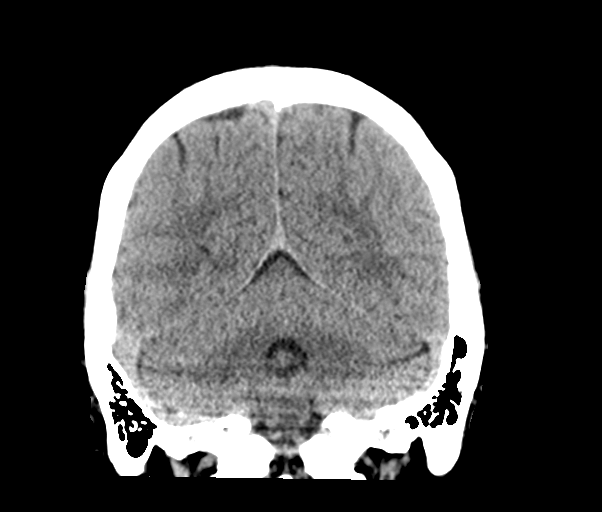
[im 29/65  brain]
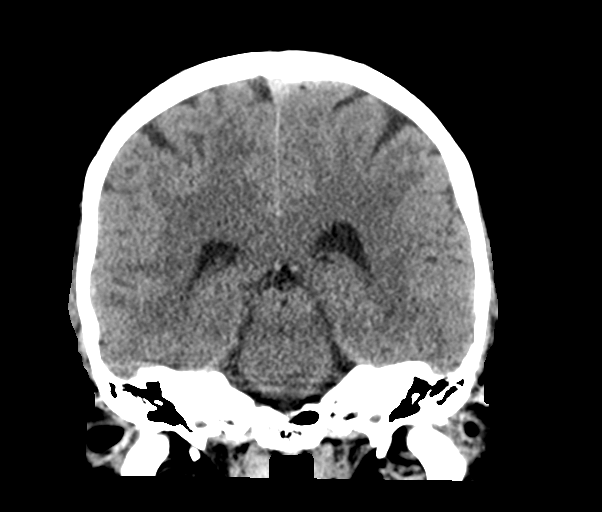
[im 36/65  brain]
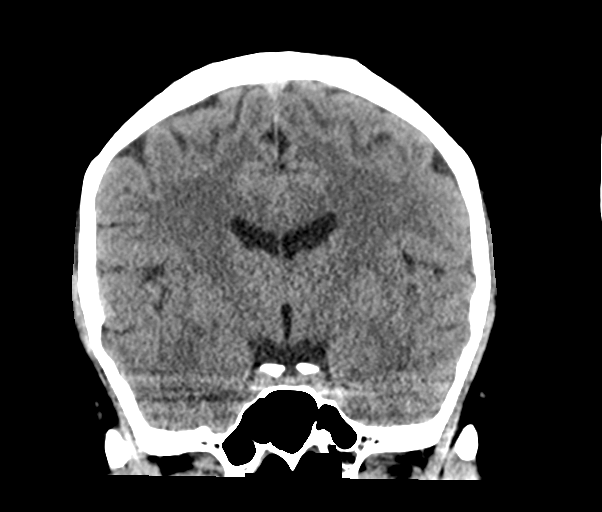

[Series 6: sagittal soft tissue · sagittal · 0.36mm/px · 3 of 72 slices shown]
[im 24/72  brain]
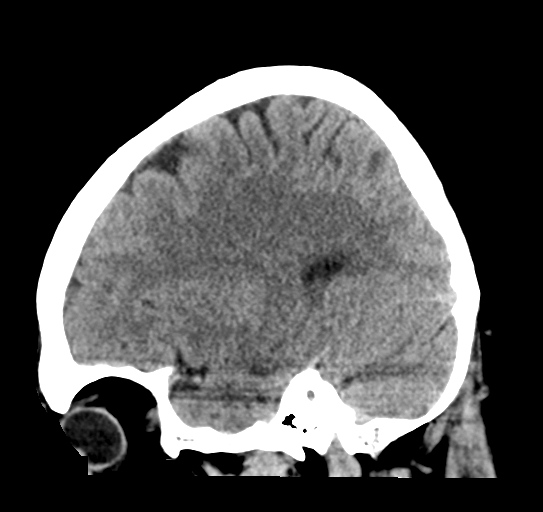
[im 36/72  brain]
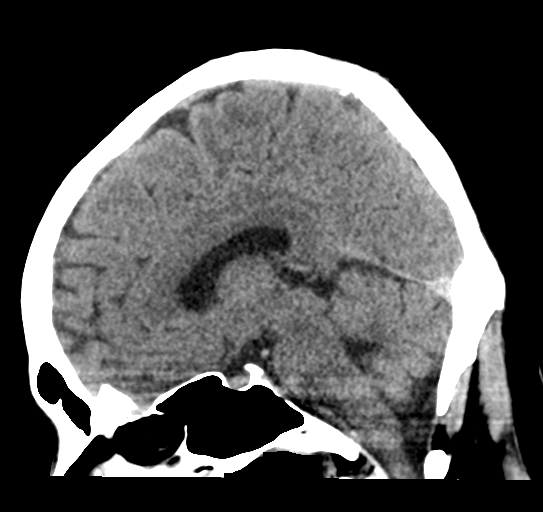
[im 48/72  brain]
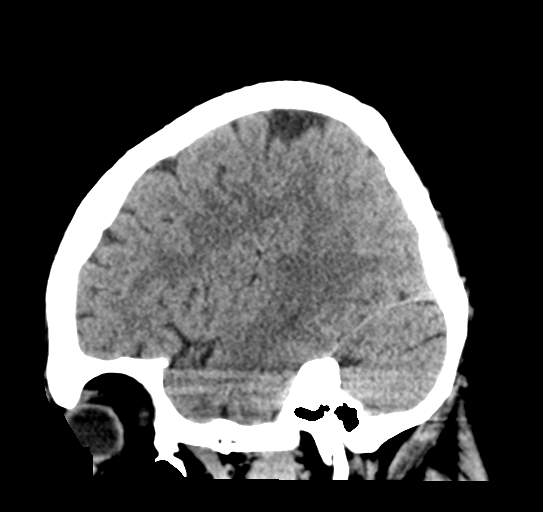

[16 of 47 positions shown; findings below may reference images not displayed]

FINDINGS: Brain: No evidence of acute infarction, hemorrhage, hydrocephalus,
extra-axial collection, or mass lesion/mass effect.

Vascular:  No hyperdense vessel or other acute findings.

Skull: No evidence of fracture or other significant bone
abnormality.

Sinuses/Orbits:  No acute findings.

Other: None.
IMPRESSION: Negative noncontrast head CT.

## 2024-08-20 ENCOUNTER — Other Ambulatory Visit (HOSPITAL_BASED_OUTPATIENT_CLINIC_OR_DEPARTMENT_OTHER): Payer: Self-pay

## 2024-08-20 MED ORDER — FLUZONE 0.5 ML IM SUSY
0.5000 mL | PREFILLED_SYRINGE | Freq: Once | INTRAMUSCULAR | 0 refills | Status: AC
  Filled 2024-08-20: qty 0.5, 1d supply, fill #0
